# Patient Record
Sex: Female | Born: 1995 | Race: White | Hispanic: No | Marital: Single | Smoking: Never smoker
Health system: Southern US, Community
[De-identification: ages and names within clinical notes are randomized; demographics above are authoritative.]

## PROBLEM LIST (undated history)

## (undated) DIAGNOSIS — F319 Bipolar disorder, unspecified: Secondary | ICD-10-CM

## (undated) HISTORY — DX: Bipolar disorder, unspecified: F31.9

## (undated) HISTORY — PX: MOUTH SURGERY: SHX715

---
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
## 2015-12-29 ENCOUNTER — Encounter: Payer: Commercial Managed Care - PPO | Admitting: Gynecology

## 2016-05-08 ENCOUNTER — Encounter: Payer: Commercial Managed Care - PPO | Admitting: Gynecology

## 2016-05-08 DIAGNOSIS — Z0289 Encounter for other administrative examinations: Secondary | ICD-10-CM

## 2018-01-17 ENCOUNTER — Encounter: Payer: Commercial Managed Care - PPO | Admitting: Gynecology
